# Patient Record
Sex: Female | Born: 1950
Health system: Western US, Academic
[De-identification: ages and names within clinical notes are randomized; demographics above are authoritative.]

---

## 2019-11-15 ENCOUNTER — Ambulatory Visit: Payer: MEDICARE

## 2019-11-15 DIAGNOSIS — M19041 Primary osteoarthritis, right hand: Secondary | ICD-10-CM

## 2019-11-15 DIAGNOSIS — M1811 Unilateral primary osteoarthritis of first carpometacarpal joint, right hand: Secondary | ICD-10-CM

## 2019-11-15 DIAGNOSIS — M25341 Other instability, right hand: Secondary | ICD-10-CM

## 2019-11-15 DIAGNOSIS — M65311 Trigger thumb, right thumb: Secondary | ICD-10-CM

## 2019-11-15 MED ADMIN — LIDOCAINE HCL 1 % IJ SOLN: INTRA_ARTICULAR | @ 23:00:00 | Stop: 2019-11-15 | NDC 00409427601

## 2019-11-15 MED ADMIN — TRIAMCINOLONE ACETONIDE 40 MG/ML IJ SUSP: INTRA_ARTICULAR | @ 23:00:00 | Stop: 2019-11-15 | NDC 00003029320

## 2019-11-15 NOTE — Progress Notes
Hand/Foot/Upper/Lower Extrem Drain/Inject: R thumb MCP, R thumb    Date/Time: 11/15/2019 2:40 PM  Performed by: Lavonia Drafts., MD  Authorized by: Lavonia Drafts., MD     Consent Given by:  Patient  Site marked: the procedure site was marked    Timeout: prior to procedure the correct patient, procedure, and site was verified    Indications:  Pain  Condition: trigger finger    Location:  Thumb  Site:  R thumb MCP  Site:  R thumb  Prep: patient was prepped and draped in usual sterile fashion    Approach:  Volar  Needle Size:  27 G  Medications:  20 mg triamcinolone acetonide 40 mg/mL; 1 mL lidocaine 1%  Patient tolerance:  Patient tolerated the procedure well with no immediate complications   Right trigger thumb

## 2019-11-15 NOTE — Progress Notes
DATE: 11/15/2019   NAME: Beth Hunt   MRN: [id number]   DOB: [birth year]   AGE: 69 y.o.       Lavonia Drafts, MD        Patient was seen in our [facility] office today.    Chief Complaint: Right thumb pain, stiffness & locking.    History: The patient presents today for hand/upper extremity orthopedic evaluation  regarding the above chief complaints. The patient is a 69 y.o. female, left-hand dominant, who presents with a 1 month history of symptoms after no recent injuries. The frequent pain is discomforting, severity 3/5, localized in the thumb without radiation. Moreover, the symptoms are exacerbated by use and alleviated by rest.    No previous studies have been obtained.   No previous med eval for this condition.     Past Medical History: The past medical history was reviewed by myself and the patient today.    Past Surgical History: None noted by the patient.    Medications: See attached patient medication list.    Allergies: Sulfa medications, penicillin, latex and tape.    Patient Social History: The patient is single, non smoker, social drinker  and lives alone.    Family Medical History: See patient questionnaire and intake form.     Review of Systems: The review of systems as documented today in the medical records is remarkable for the positive orthopedic problems discussed above and is also contributory with respect to Constitutional, ENT, Cardiovascular, GU, Skin, Neurologic, Endocrine, Hematologic, Psychiatric, Gastrointestinal, Respiratory, Eyes and Allergic/Immunologic systems.    Physical Examination:   Vitals:    11/15/19 1442   Weight: (!) 225 lb (102.1 kg)   Height: 5' 5'' (1.651 m)      Constitutional: in general the patient is well-developed and well-nourished female in some minor distress. Overall appearance and body habitus is normal.     Eyes: pupils are round, equal and reactive to light.    ENT: external examination of the head, neck and facial areas reveals no abnormalities.      Respiratory: regular breathing; unlabored.    Cardiovascular: regular rate with intact normal pulses.     Lymphatic: no evidence of lymphadenopathy.    Psychiatric: patient is alert and oriented to person, time and place, with a pleasant mood and affect. Speech is normal, and the patient is appropriately conversant.     Skin: no erythema, induration or other significant skin lesions.    Evaluation of the right arm proximal to the hand is normal.  Observation of the hand shows a catching phenomenon to be present in the thumb. Palpation of the proximal flexor tendon sheath at the level of the A1 pulley causes pain.  Palpation also reveals crepitus as the digit is flexed and extended. No mass is palpable at the A1 pulley area. There is pain, stiffness & locking.  Digital flexibility is slightly restricted.  In addition, there is some a mild thickening of the capsular tissues about the ulnar aspect of the thumb MCP J. Stress testing of this joint produces gross instability of the ulnar collateral ligament.  Surprisingly, she has no symptoms associated with the thumb instability.  Furthermore, there is pain localized to the area of the thumb basal joint.  The basal joint grinding in torquing test is mildly positive with mild pain and some crepitus.  She has minimal symptoms at the basal joint.  Arthritic changes are also evident at her distal interphalangeal joints where she has large  Heberden's nodes, but once again virtually no symptoms.  Sensibility is well-preserved to light touch.  The vascular status is normal. Skin tone and turgor are normal.    DIAGNOSES: 1.  Right thumb trigger digit.     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     3. 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      DISCUSSION: A thorough discussion was carried out with the patient regarding the origin of the triggering and locking in the digit.  I explained that this was a form of tendinitis called stenosing tenosynovitis which involves the flexor tendon and its associated sheath.  Because of swelling in the tendon, or narrowing and thickening of the sheath mechanism, the tendon is catching, producing the pain, stiffness and the catching or triggering-like phenomena.     Arthritis occurs when the normal smooth articular cartilage lining the joint is damaged.  Initially the cartilage becomes thinned and quite rough but is still present.  In this early stage of arthritis the patient will complain of an aching type pain which increases with activity, some mechanical-type symptoms such as a catching sensation and possibly swelling of the joint.  As the arthritis becomes progressively more severe and as the cartilage further deteriorates exposing underlying bone the pain will become correspondingly more frequent and severe.  Eventually the pain will occur with only minimal activity, and possibly even pain at rest.    Arthritis may follow a specific injury which causes damage to the articular cartilage and subsequent progressive deterioration.  Arthritis may also be due to repetitive overuse.  Some patients will develop arthritis as a consequence of an inflammatory/rheumatologic process such as rheumatoid arthritis, lupus, or gout which subsequently causes inflammation in the joint with progressive cartilage deterioration.  This type of arthritic problem will commonly affected multiple joints with some degree of symmetry.  In some patients there may also be a familial or genetic component.    Treatment for arthritis depends on the stage of the arthritis and the patient???s level of symptoms.  In the early or minor stages treatment consists of anti-inflammatory medication, both oral and topical, to reduce joint inflammation, and appropriate activity modifications to reduce the overall stress on the affected joints.  Homepathic medications such as glucosamine and chondroitin sulfate can also sometimes be effective.  On occasion cortisone injections can help to relieve the joint inflammation and arthritic pain.  Corticosteroids tend to work fairly rapidly but the effects are often temporary with resultant return of joint inflammation and pain.  Repeated corticosteroid injections can possibly result in weakening of the ligaments, tendon injury, and further articular cartilage deterioration.  Cosmetic side effects such as blanching of the skin and fat atrophy with a dimpling effect may also occur.  Corticosteroid injections should be limited to not more than every 3-4 months.  Exercise is also very helpful as it maintains strength, joint range of motion, overall functionality which also helps to alleviate the arthritic pain.  An ???arthritis assessment??? with an occupational therapist may also be helpful in educating the patient on the use of adaptive aids, assistive devices and joint specific splints; and also on maintaining joint flexibility with preserved functionality.  Referral to a Rheumatologist for medical management of the arthritic condition is also very helpful especially if the patient is not in need of orthopedic surgery to treat the underlying arthritic condition.      PLAN: Appropriate orthopedic care would now consist of conservative management.  This includes  avoidance of any abusive activities such as repetitive and forceful gripping, heavy lifting, or any associated hand-intensive activities.  The prescription of a anti-inflammatory medication, as well as a cortisone injection into the flexor tenosynovial sheath, may also be beneficial. I explained that with a cortisone injection there is an approximate 65-75% chance of relief.  Sometimes additional injections are necessary if there is incomplete relief of the painful symptoms or a recurrence of the triggering.  Approximately 25-35% of patients will not obtain significant relief from the above measures, and a surgical decompression would then be indicated.  This is done as an outpatient procedure utilizing a local anesthetic block.  If surgery is eventually necessary, the prognosis for successful outcome is extremely good.  I think she would do well by starting off with either a NSAID medicine or a cortisone injection. Possible side effects of each treatment option discussed.  She prefers the injection.    Today under sterile conditions the flexor tenosynovial sheath of the right trigger thumb was safely injected with 2 ml of corticosteroid and 1 ml of local anesthetic.  The patient was informed and understands that the injection could cause some blanching of the skin, and fat atrophy with a dimpling of the soft tissues in the palmar finger area. Rarely is there infection or injury to the soft tissues or flexor tendon from the needle or the medication. Overall, cortisone is a generally safe medicine with very few patients developing an abnormal medical reaction. There is about a 65-75% chance of success with the injection. On occasion, additional injections may be needed. If the cortisone injections fail to alleviate the triggering of the finger, surgery would be an option for further care.      In regard to the osteoarthritis that is developing in her hand, she has virtually no symptoms and consequently specific care is not warranted at this point time.  I did discuss with her in detail the fact that she has instability of the thumb MCP J due to an incompetent ligament, and that the natural history would involve progression to joint arthritis which could become symptomatic for her in the future.  Once again, given the fact that she is asymptomatic today, she is not desirous of pursuing any further care.  Further follow-up when needed.    I spent total 30 minutes formulating todays visit which included:    [x] Preparing to see the patient (e.g., review of tests)   [x] Obtaining and/or reviewing separately obtained history   [x] Performing a medically appropriate examination and/or evaluation   [x] Counseling and educating the patient/family/caregiver   [x] Documenting clinical information in the electronic or other health record   [x] Independently interpreting results (not separately reported) and communicating results to the patient/family/caregiver   [x] Care coordination (not separately reported)    Lavonia Drafts, MD  ORTHOPEDIC SURGEON

## 2020-08-28 IMAGING — MG MAMMOGRAPHY SCREENING BILATERAL 3D TOMOSYNTHESIS WITH CAD
7 series · 8 of 19 positions shown · non-contrast
Comparison: 11/07/1808 04/07/2013.

MAMMOGRAPHY SCREENING BILATERAL 3D TOMOSYNTHESIS WITH CAD, 08/28/2020 [DATE]: 
CLINICAL INDICATION: Screening exam.
TECHNIQUE: Digital bilateral mammograms and 3-D Tomosynthesis were obtained. 
These were interpreted both primarily and with the aid of computer-aided 
detection system. 
BREAST DENSITY: (Level D) The breasts are extremely dense, which lowers the 
sensitivity of mammography.

[L MLO]
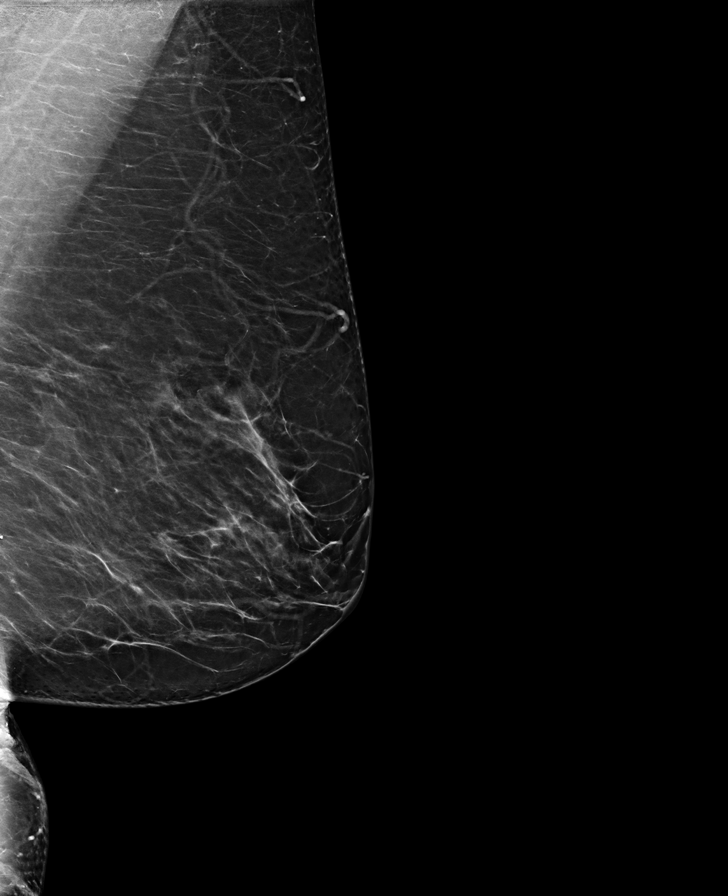

[L CC]
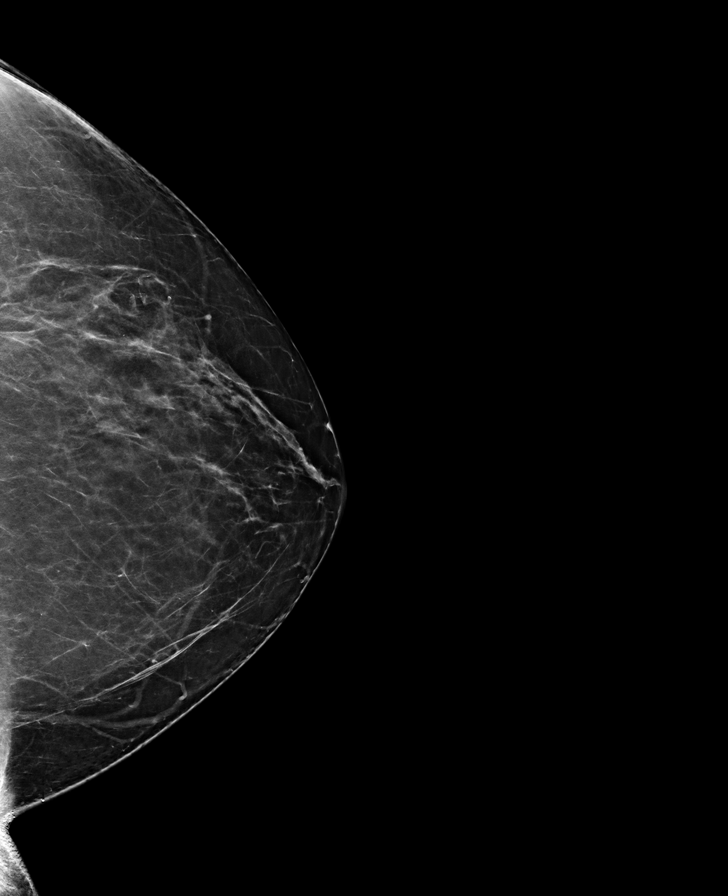

[R CC]
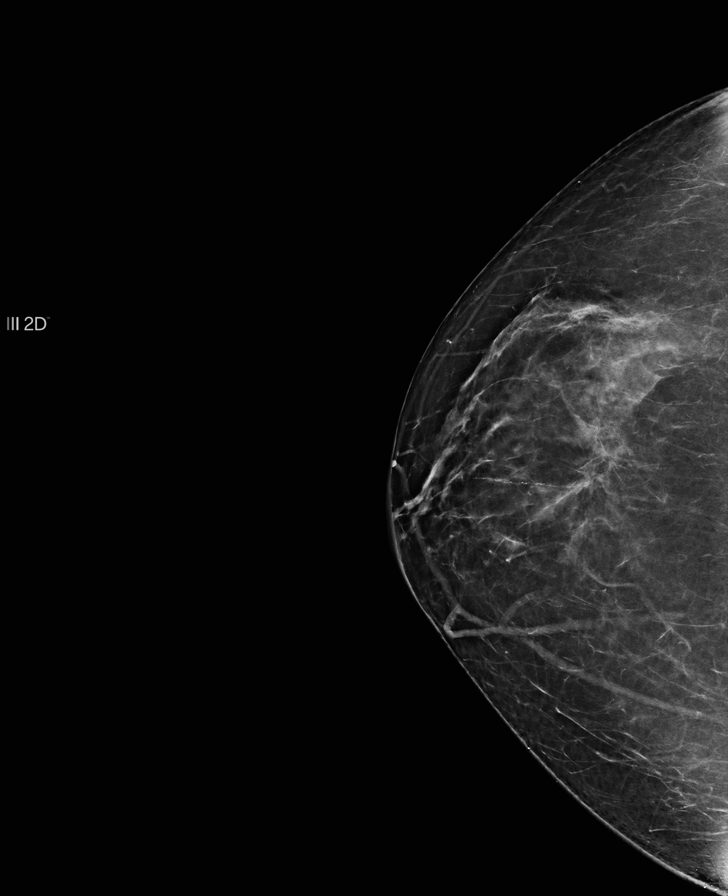

[R MLO]
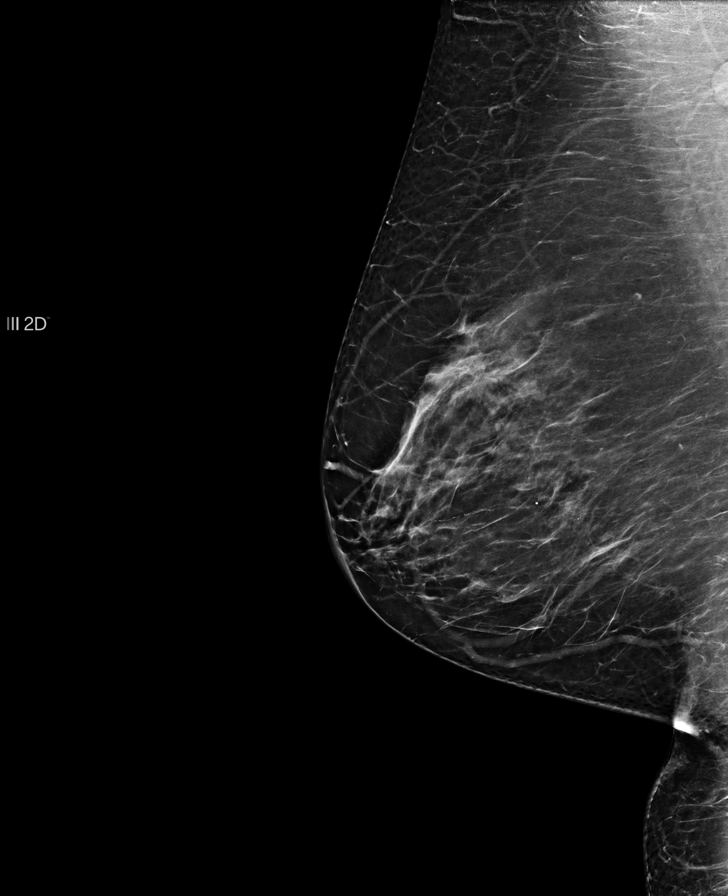

[L CC tomo · 2 of 78 frames shown]
[frame 26/78]
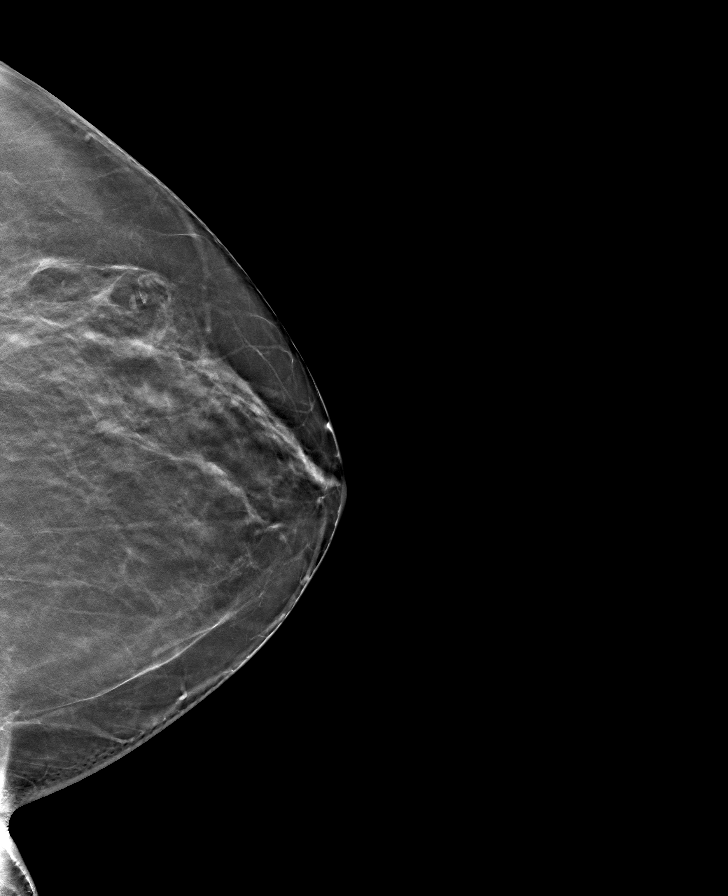
[frame 39/78]
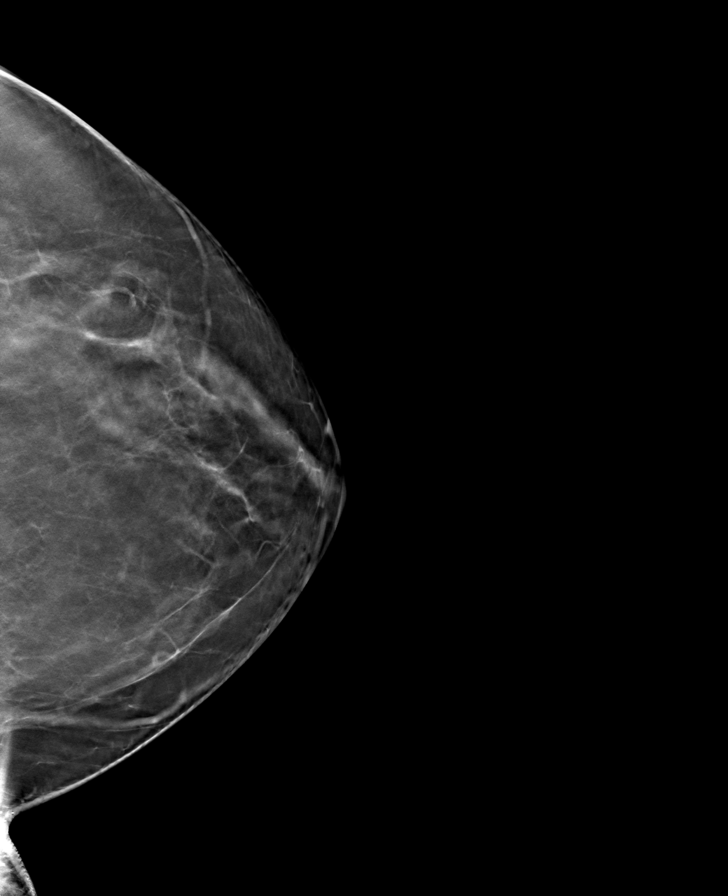

[L MLO tomo · tomo slice 40/79.0]
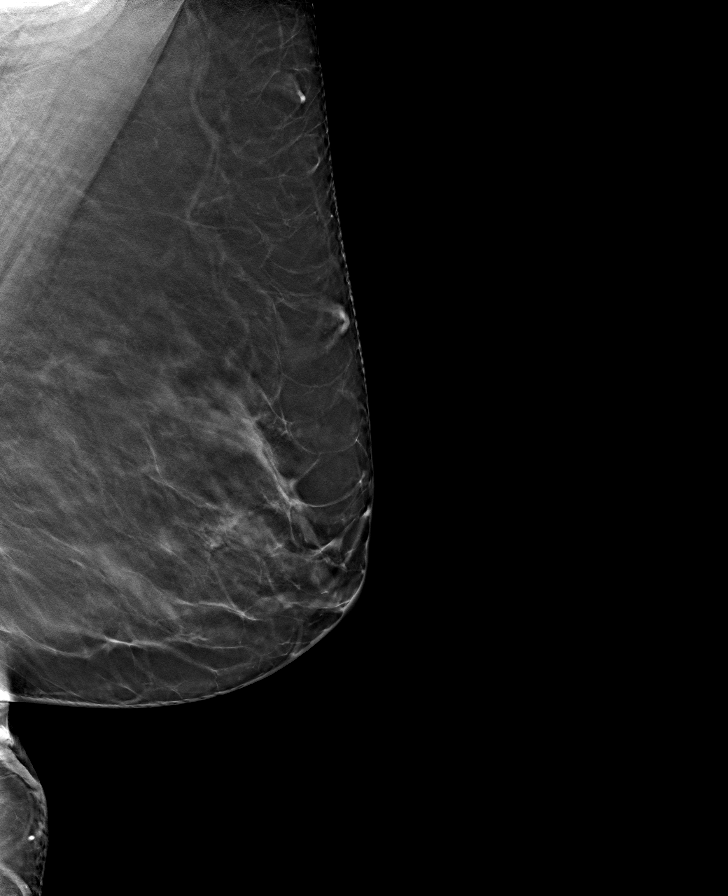

[R CC tomo · tomo slice 39/78.0]
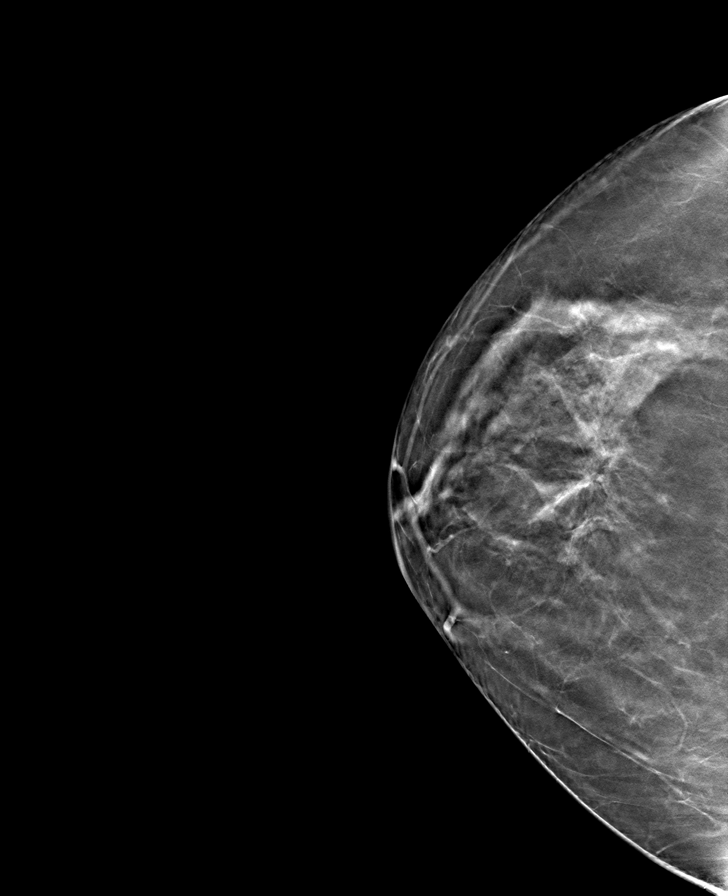

[8 of 19 positions shown; findings below may reference images not displayed]

FINDINGS: Benign calcification No suspicious mass, calcifications, or area of 
architectural distortion in either breast.
IMPRESSION: Stable mammogram. 
( BI-RADS 2) Benign findings. Routine mammographic follow-up is recommended.

## 2021-04-01 ENCOUNTER — Ambulatory Visit: Payer: MEDICARE

## 2021-09-04 IMAGING — MR MRI RIGHT KNEE WITHOUT CONTRAST
4 of 6 series · 20 of 40 positions shown · IV contrast (gadolinium)
Comparison: None

________________________________________________________________________________________________ 
MRI RIGHT KNEE WITHOUT CONTRAST, 09/04/2021 [DATE]: 
CLINICAL INDICATION: Chondromalacia patella, right knee.
TECHNIQUE: Multiplanar, multiecho position MR images of the knee were performed 
without intravenous gadolinium enhancement. Patient was scanned on a
magnet.

[Series 101: survey_fullfov_transversal · axial · 10.0mm · 1.84mm/px · 1 of 7 slices shown]
[im 1/7]
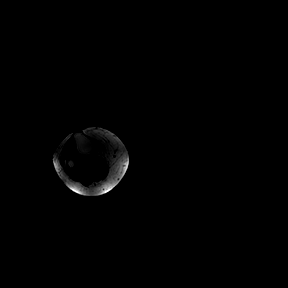

[Series 201: survey_ · axial · 10.0mm · 0.78mm/px · z∈[-20,+125]mm · 2 of 9 slices shown]
[im 1/9]
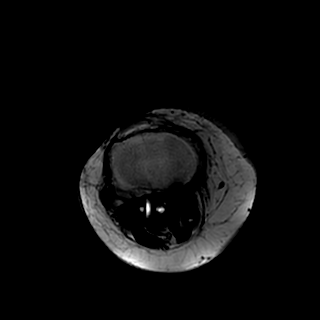
[im 9/9]
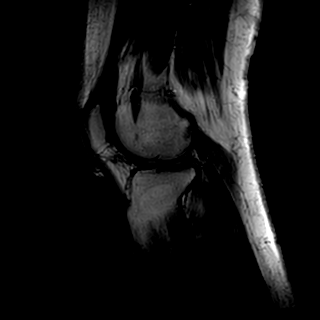

[Series 301: (person_name)_(person_name)_(person_name) · axial · 3.0mm · 0.33mm/px · z∈[-54,+68]mm · 9 of 36 slices shown]
[im 1/36]
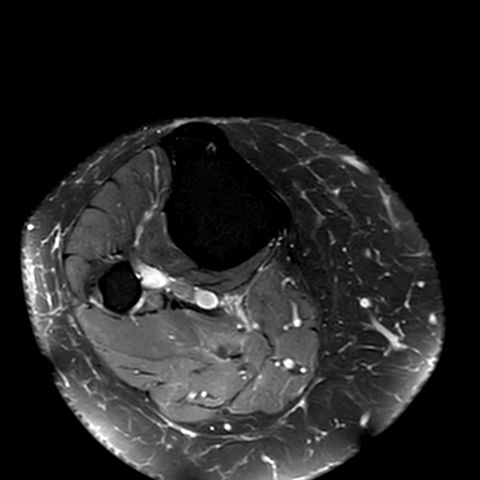
[im 5/36]
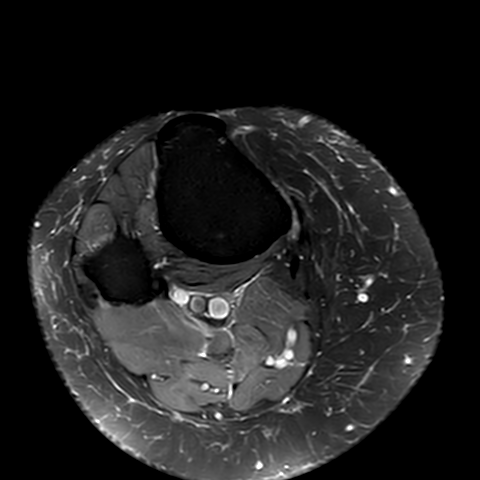
[im 9/36]
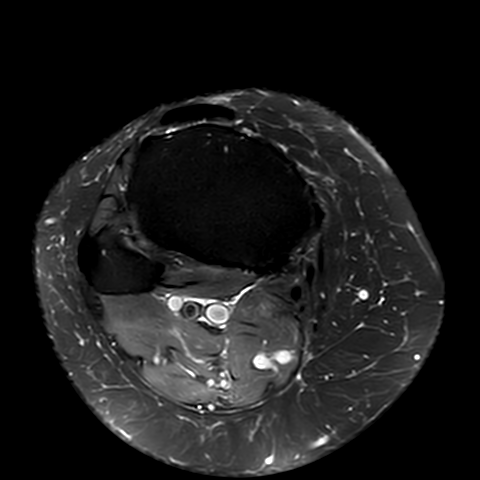
[im 14/36]
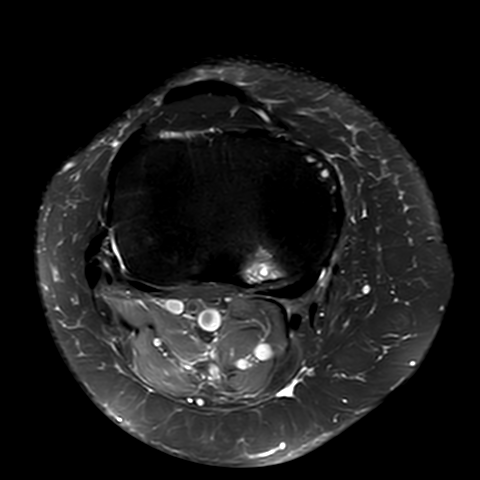
[im 18/36]
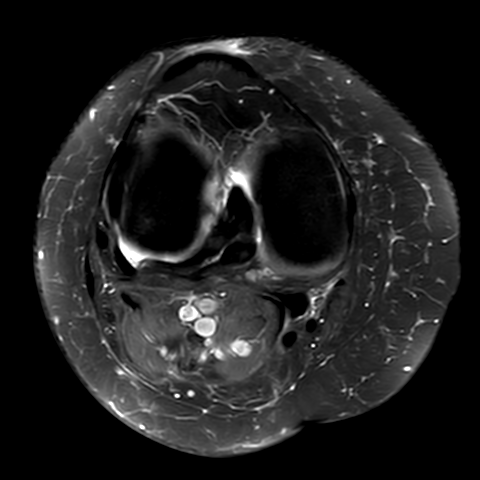
[im 22/36]
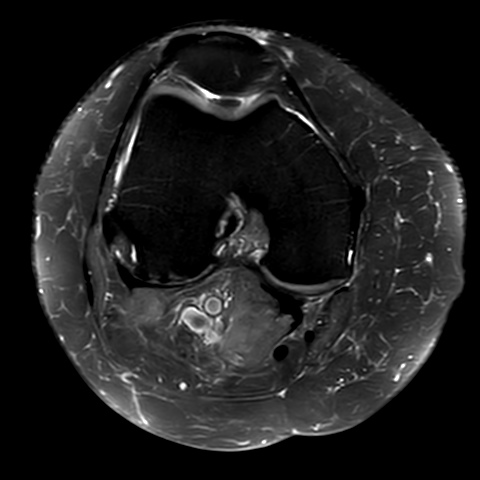
[im 27/36]
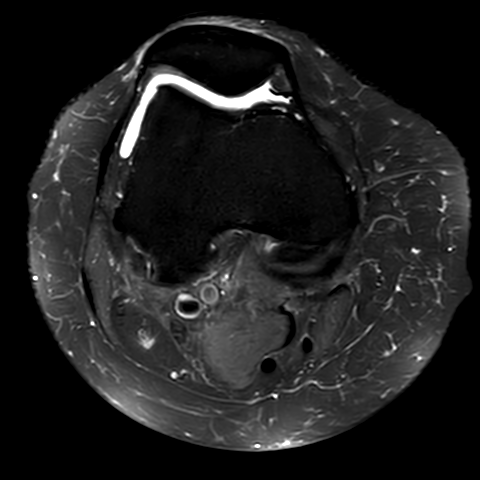
[im 31/36]
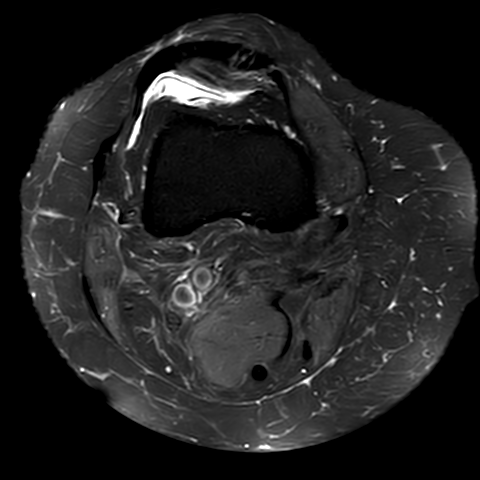
[im 36/36]
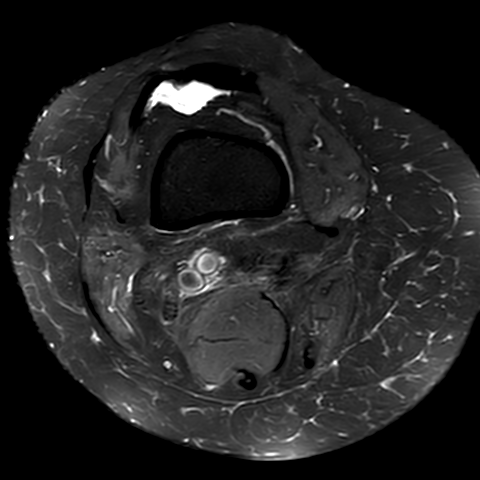

[Series 401: pd_fs_sag fh · sagittal · 3.0mm · 0.29mm/px · 8 of 32 slices shown]
[im 1/32]
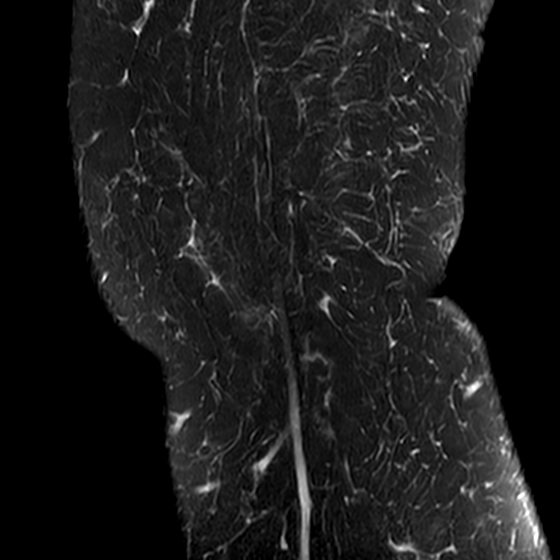
[im 5/32]
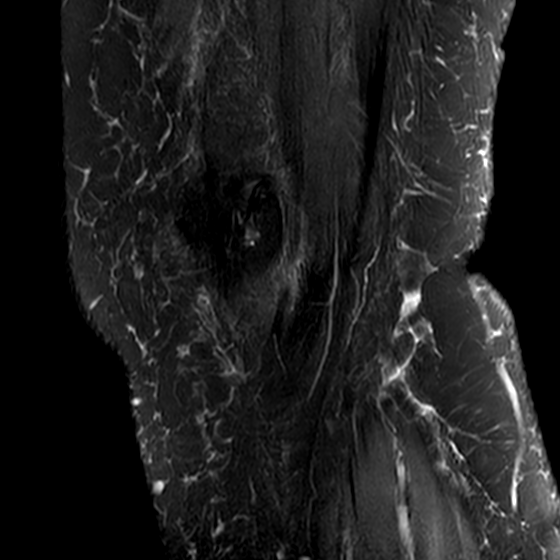
[im 9/32]
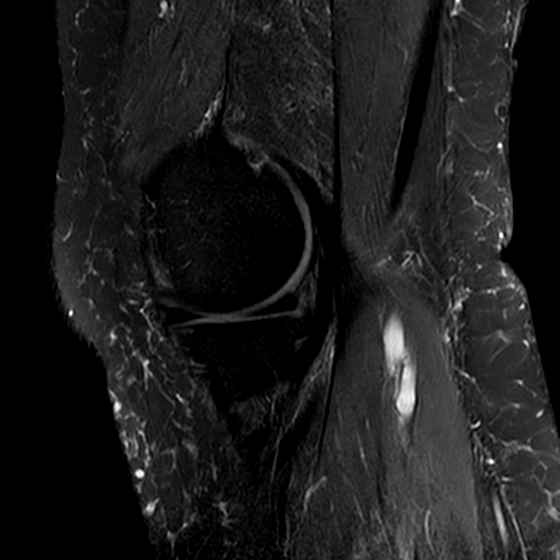
[im 14/32]
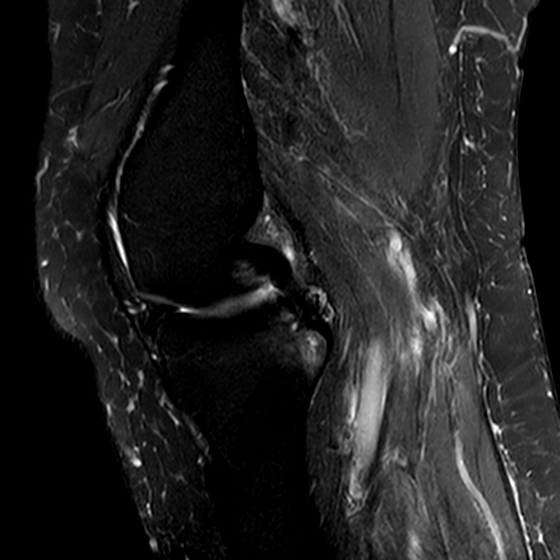
[im 18/32]
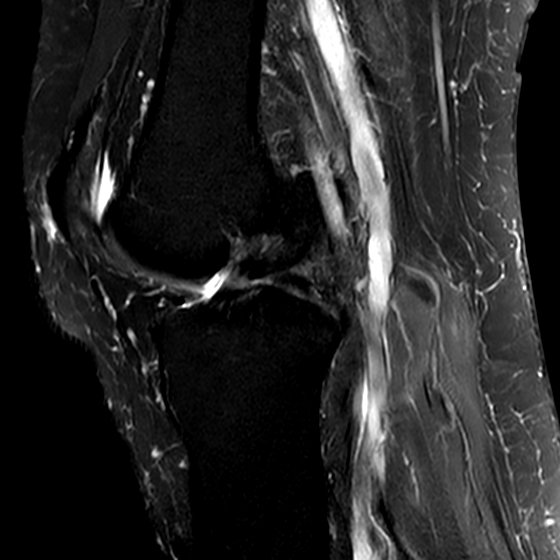
[im 23/32]
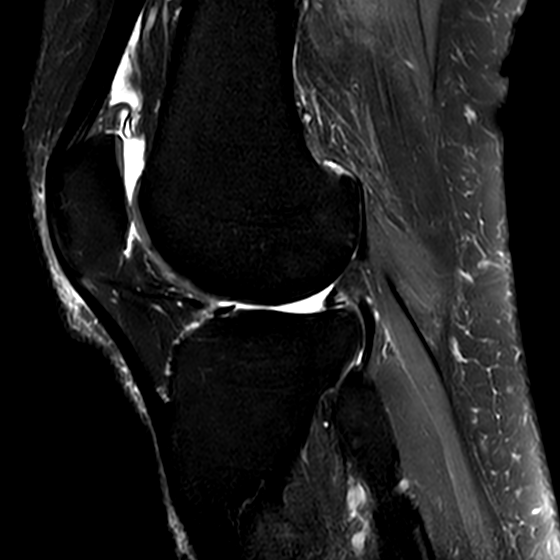
[im 27/32]
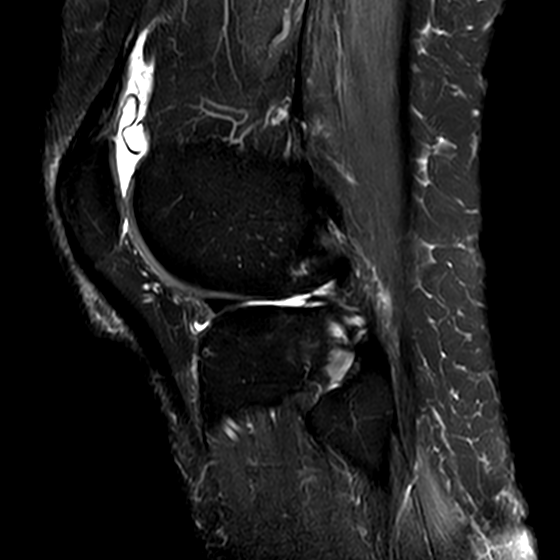
[im 32/32]
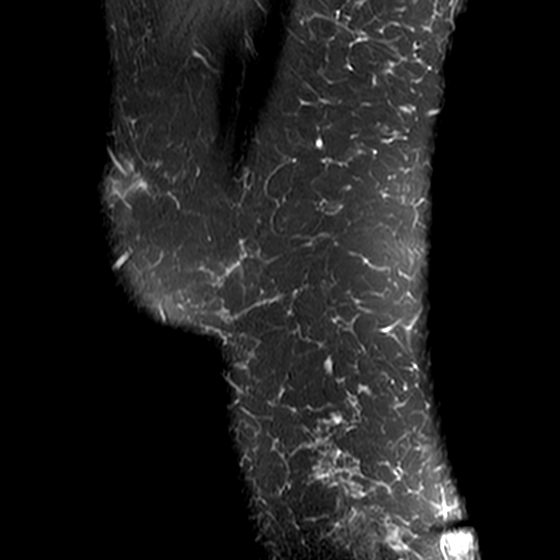

[20 of 40 positions shown; findings below may reference images not displayed]

FINDINGS: MEDIAL COMPARTMENT: The medial meniscus is intact without tear or extrusion. 
Grade III cartilage fissure of the weightbearing surface of the medial femoral 
condyle and mild adjacent grade II chondromalacia. Posterior medial tibial 
plateau osteophytes, subcortical cystic change and marrow edema. 
LATERAL COMPARTMENT: Complex tear and truncation of the posterior horn and body 
of the medial meniscus. Lateral meniscal extrusion. Up to grade IV 
chondromalacia. Remodeling of the posterior lateral tibial plateau with mild 
concavity.  
PATELLOFEMORAL COMPARTMENT: The patella is centrally located. Grade II- III 
chondromalacia of the patella and central trochlea. Tiny osteophytes. 
TIBIOFIBULAR COMPARTMENT: Negative. 
LIGAMENTS: The anterior cruciate ligament is intact. The posterior cruciate 
ligament is intact with mild intermediate signal intensity in the mid ligament. 
The medial collateral ligament and lateral collateral ligaments are preserved. 
EXTENSOR MECHANISM: The quadriceps and patellar tendon are preserved. The medial 
and lateral retinacula are intact. 
POSTEROMEDIAL CORNER: The semimembranosus and pes anserine tendons are 
preserved. The posterior oblique ligament and posterior medial joint capsule are 
intact. 
POSTEROLATERAL CORNER: The popliteal tendon and popliteofibular ligament are 
intact. The biceps femoris is negative. 
BONES: No fracture or contusion or stress response.  
ADDITIONAL FINDINGS: Moderate joint effusion. Non-thickened plica. No popliteal 
cyst. The musculature is normal without mass, signal abnormality or atrophy. 
Neurovascular bundles are negative. Subcutaneous tissues are negative.
IMPRESSION: 1.  Complex tear and truncation of the posterior horn and body of the medial 
meniscus.  
2.  Marked lateral compartment, moderate patellofemoral and mild medial 
compartment degenerative change. 
3.  Moderate joint effusion.

## 2022-01-06 IMAGING — DX KNEE 4 VIEWS LEFT
1 series · 4 of 4 positions shown · non-contrast
Comparison: None.

________________________________________________________________________________________________ 
KNEE 4 VIEWS LEFT, 01/06/2022 [DATE]: 
CLINICAL INDICATION: Pain.

[Series 1: AP · 0.14mm/px · 4 of 4 slices shown]
[im 1/4]
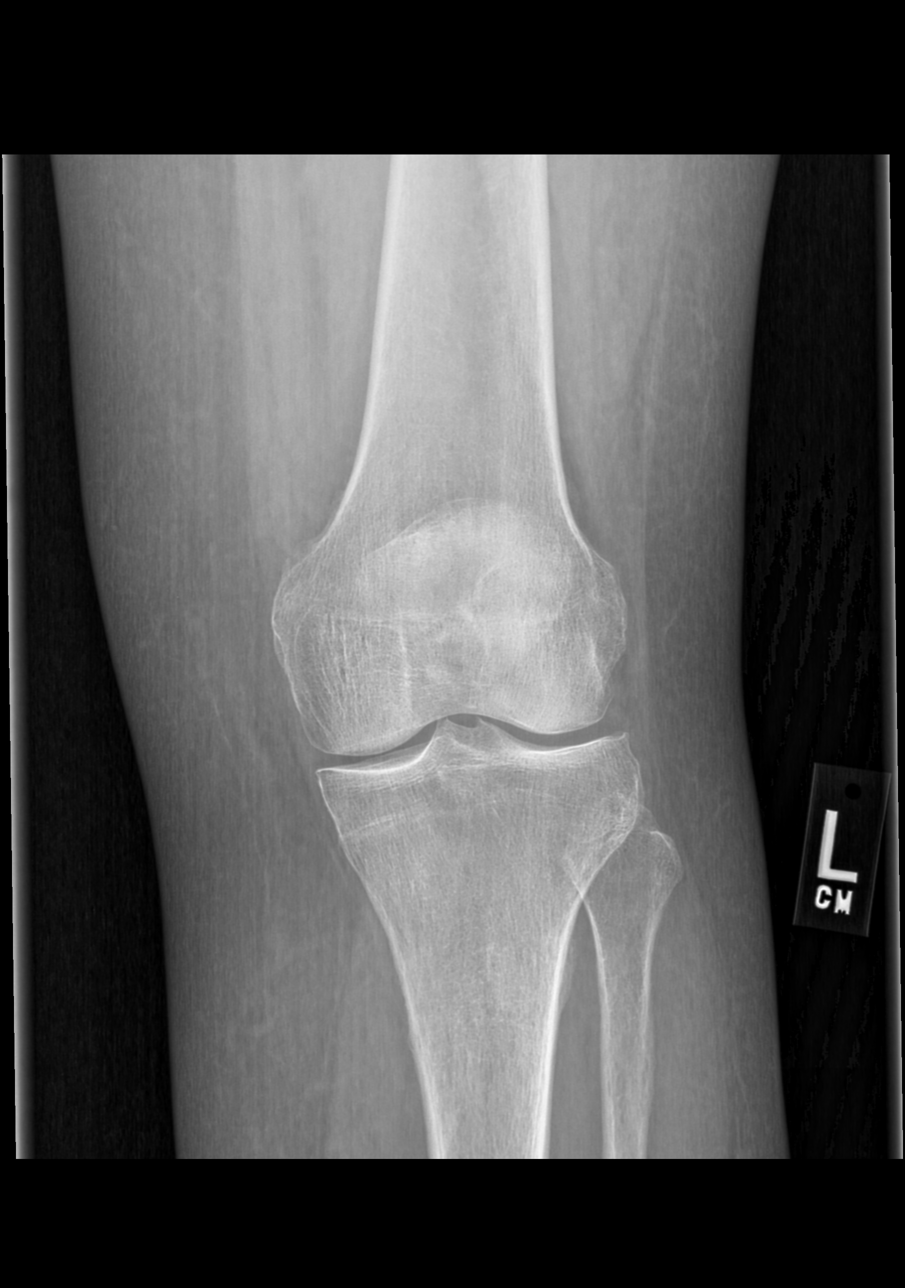
[im 2/4]
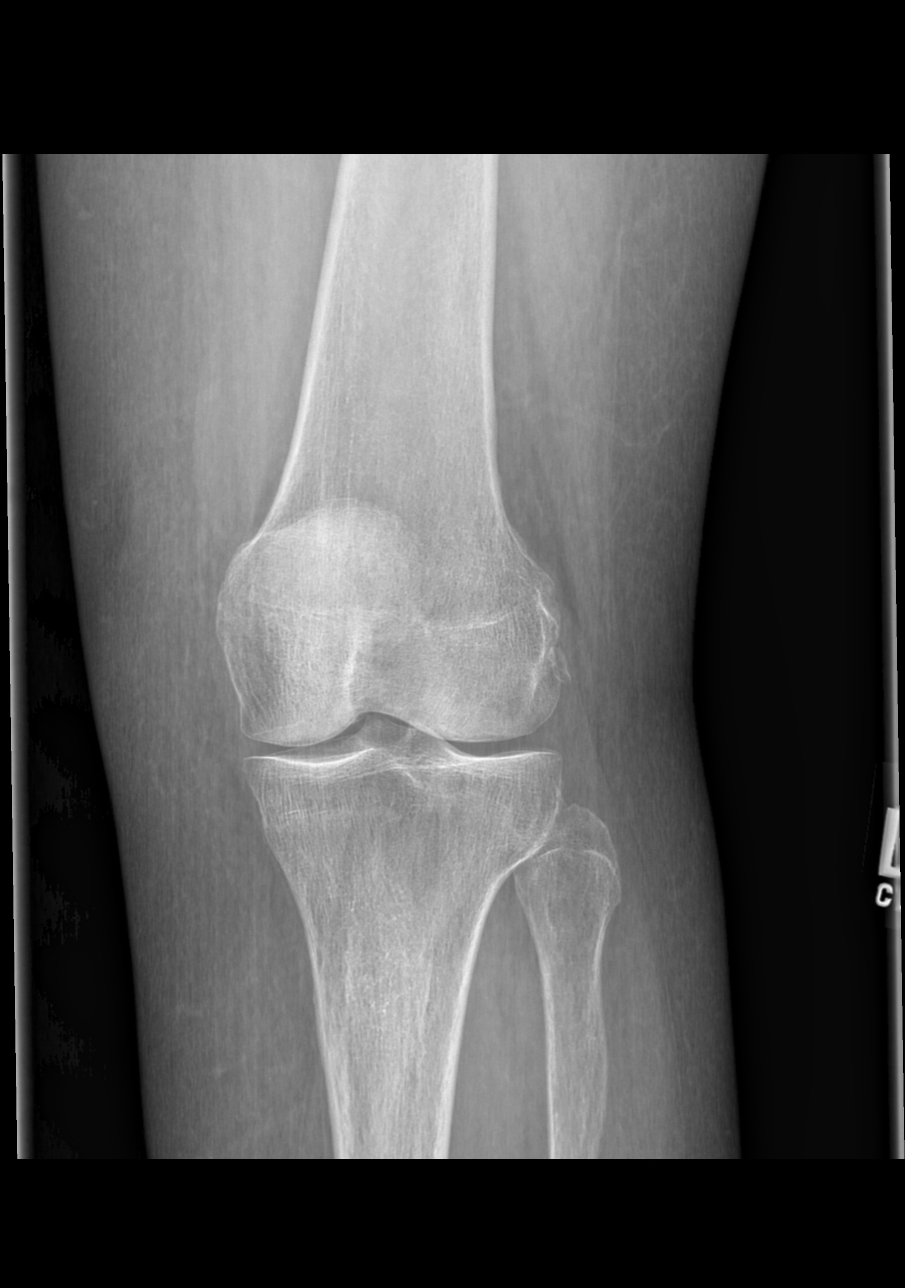
[im 3/4]
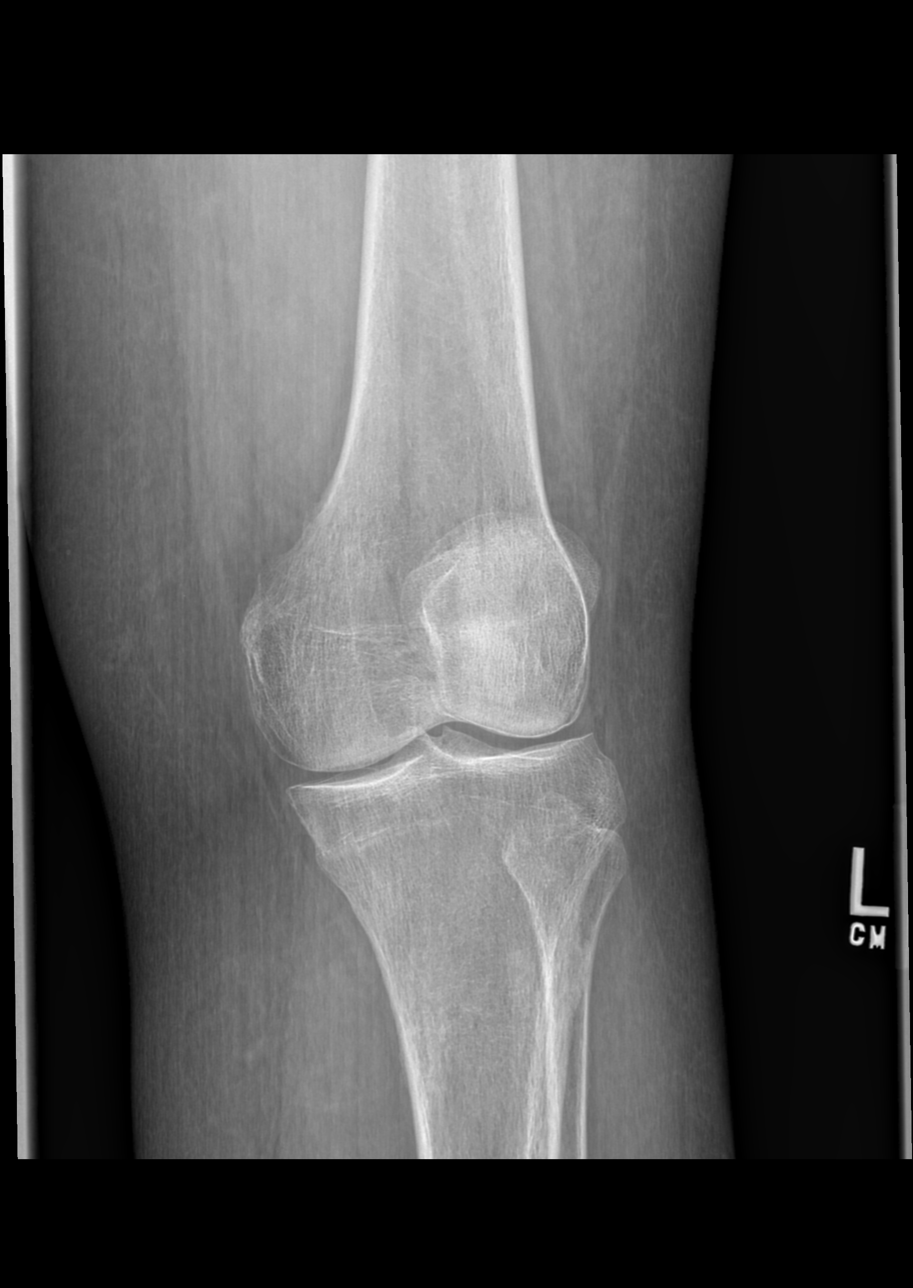
[im 4/4]
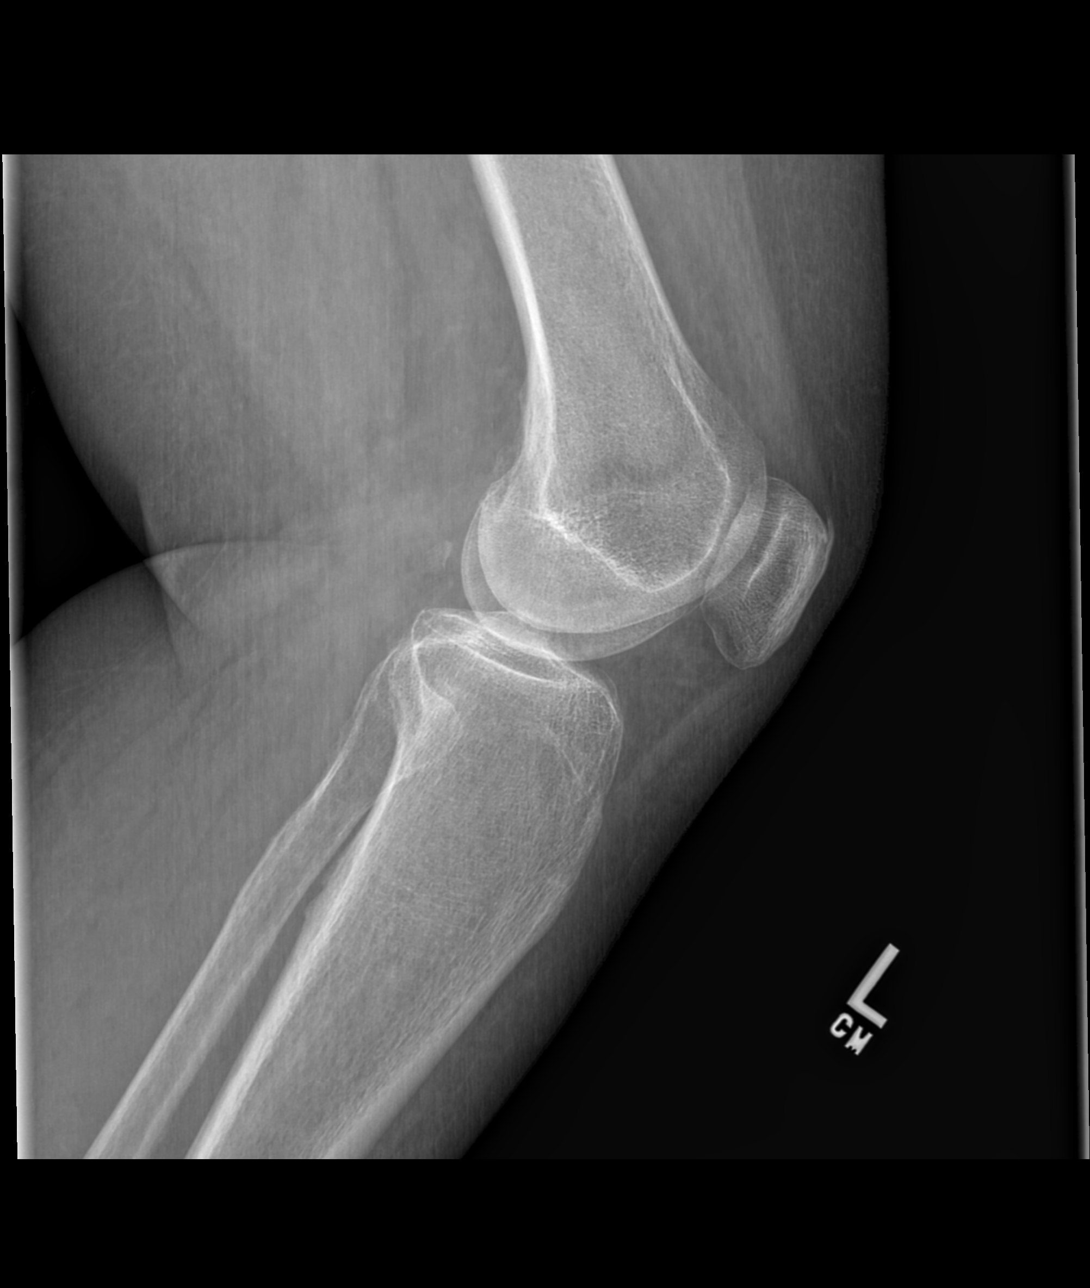

[4 of 4 positions shown; findings below may reference images not displayed]

FINDINGS: Atherosclerotic changes. Small enthesophyte superior patella. No 
joint effusion. Joint spaces appear well-maintained.
IMPRESSION: No significant arthritic changes noted. Please see discussion above.

## 2022-01-12 IMAGING — MR MRI LEFT KNEE WITHOUT CONTRAST
5 of 6 series · 23 of 40 positions shown · IV contrast (gadolinium)
Comparison: 12/07/2021 radiographs

________________________________________________________________________________________________ 
MRI LEFT KNEE WITHOUT CONTRAST, 01/12/2022 [DATE]: 
CLINICAL INDICATION: Pain in left knee.
TECHNIQUE: Multiplanar, multiecho position MR images of the knee were performed 
without intravenous gadolinium enhancement. Patient was scanned on a
magnet.

[Series 101: survey_fullfov_transversal · axial · 10.0mm · 1.84mm/px · z∈[-51,+51]mm · 2 of 7 slices shown]
[im 1/7]
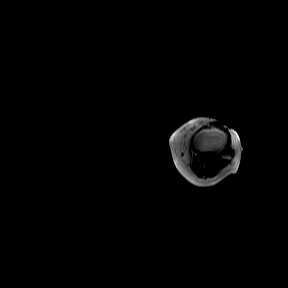
[im 7/7]
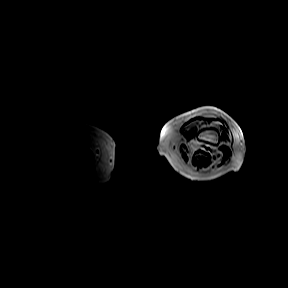

[Series 201: survey_ · axial · 10.0mm · 1.17mm/px · z∈[-20,+149]mm · 2 of 9 slices shown]
[im 1/9]
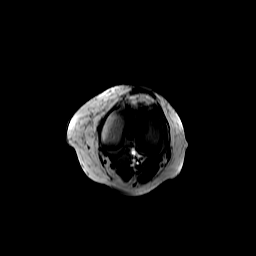
[im 9/9]
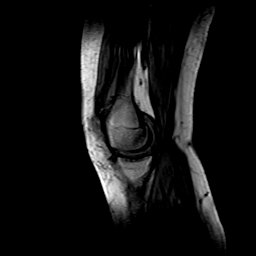

[Series 301: (person_name)_(person_name)_(person_name) · axial · 3.0mm · 0.37mm/px · z∈[-97,+28]mm · 8 of 36 slices shown]
[im 1/36]
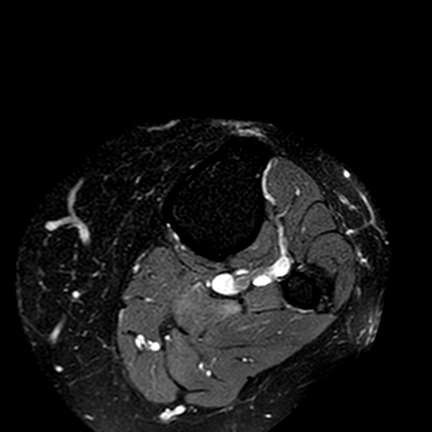
[im 4/36]
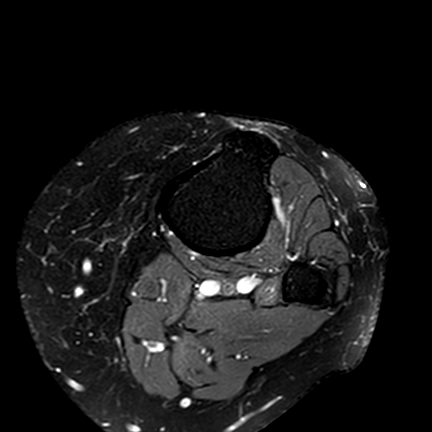
[im 12/36]
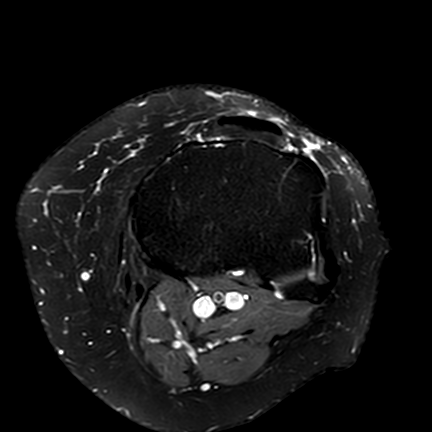
[im 16/36]
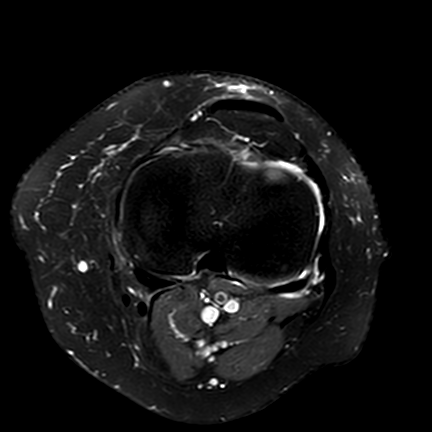
[im 20/36]
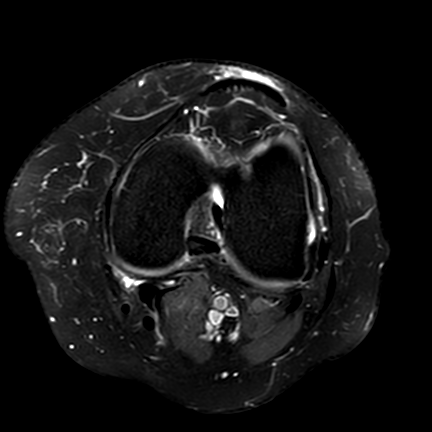
[im 24/36]
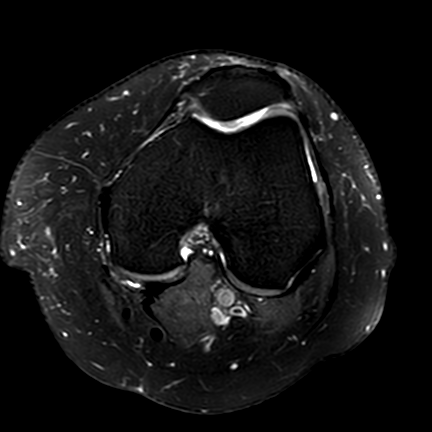
[im 32/36]
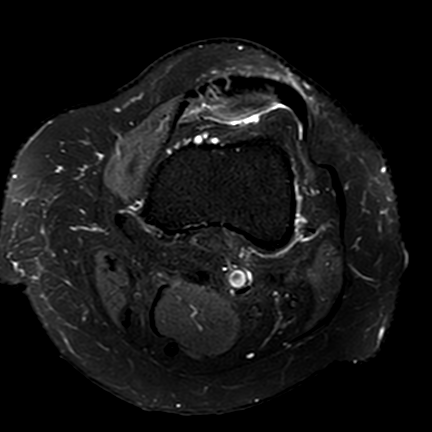
[im 36/36]
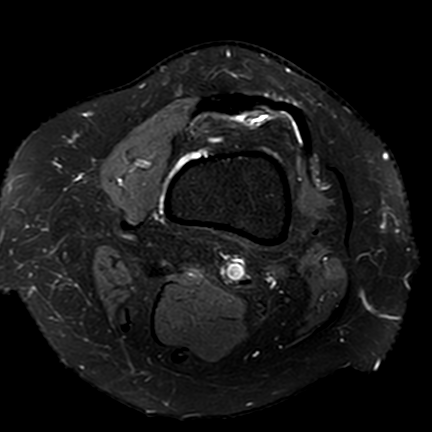

[Series 401: pd_fs_sag fh · sagittal · 3.0mm · 0.31mm/px · 8 of 30 slices shown]
[im 1/30]
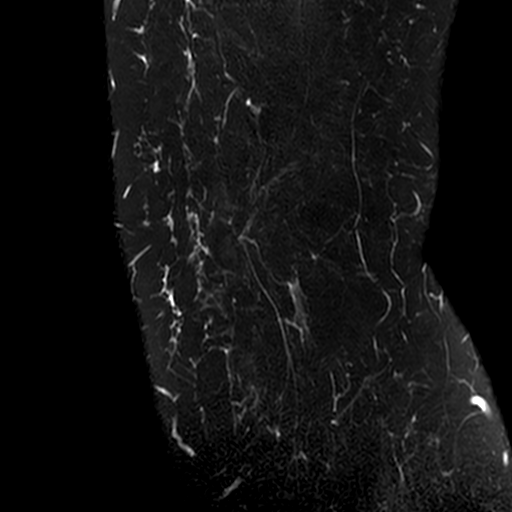
[im 5/30]
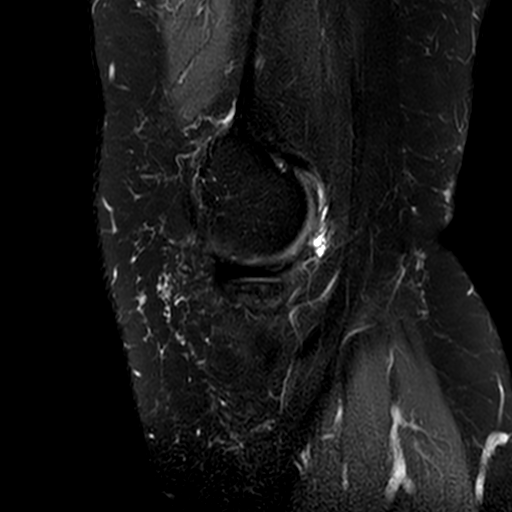
[im 9/30]
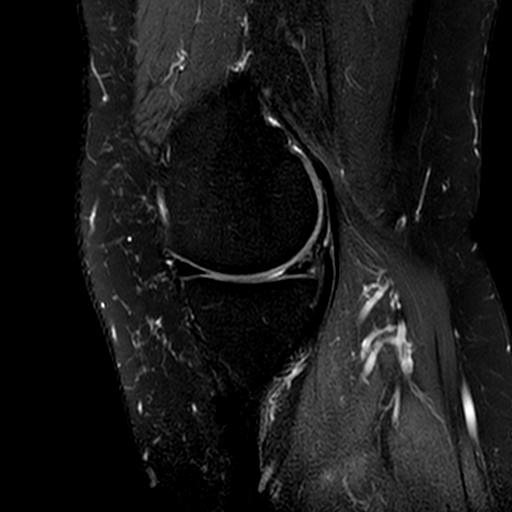
[im 13/30]
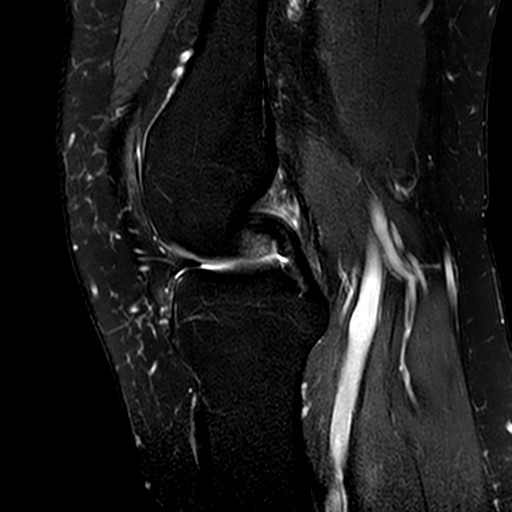
[im 17/30]
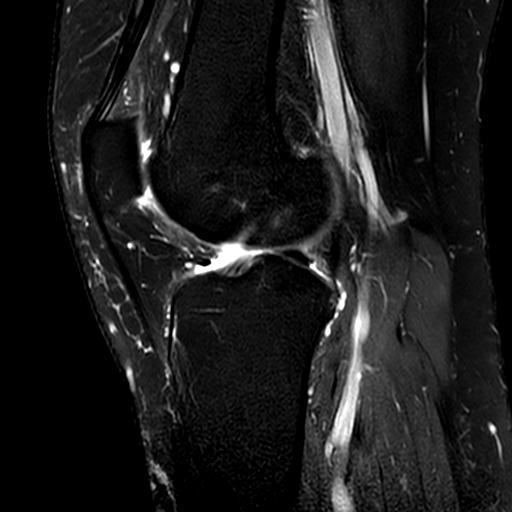
[im 21/30]
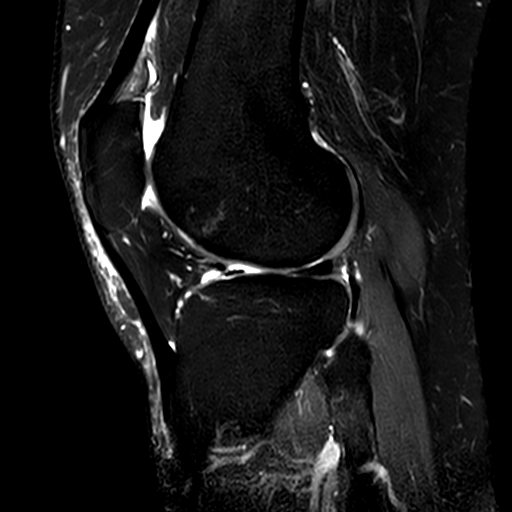
[im 25/30]
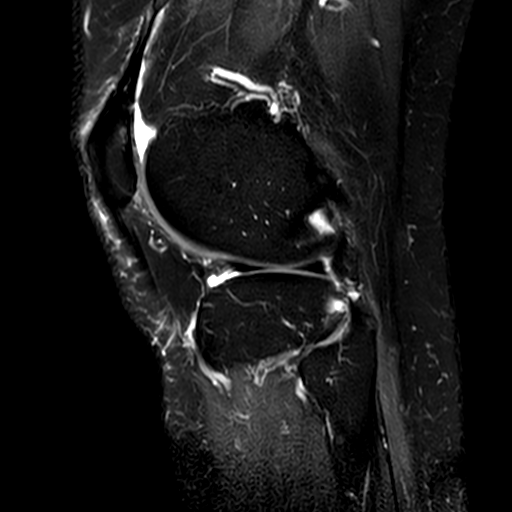
[im 30/30]
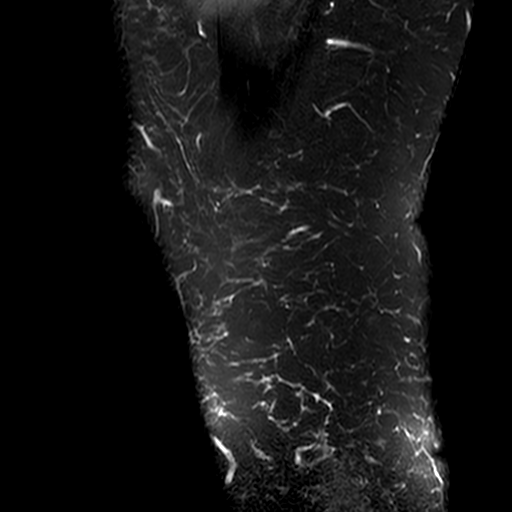

[Series 501: t1_cor · coronal · 3.0mm · 0.31mm/px · 3 of 32 slices shown]
[im 1/32]
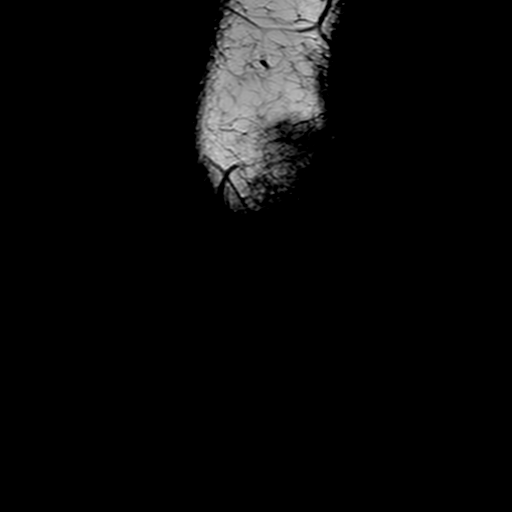
[im 4/32]
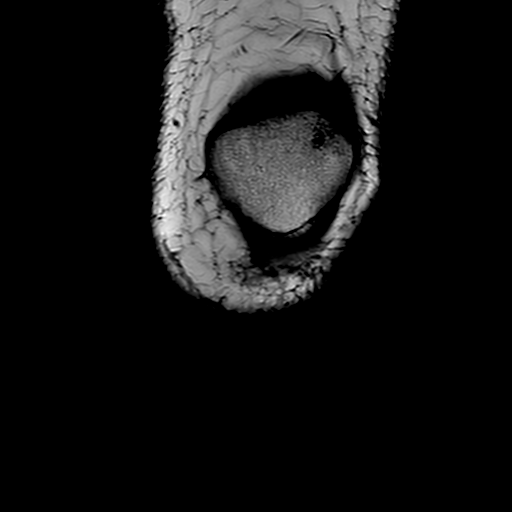
[im 8/32]
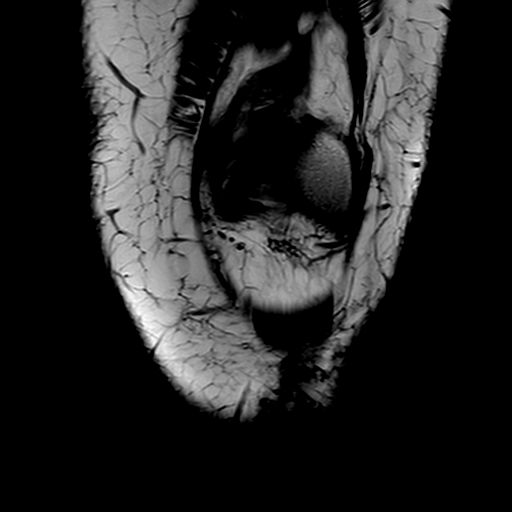

[23 of 40 positions shown; findings below may reference images not displayed]

FINDINGS: MEDIAL COMPARTMENT: Complex tear of the posterior horn of the medial meniscus. 
Up to grade II chondromalacia. Tiny osteophytes. 
LATERAL COMPARTMENT: The lateral meniscus is intact without tear or extrusion. 
Small foci of grade III chondromalacia and grade II chondromalacia of the 
remaining joint. Tiny osteophytes.  
PATELLOFEMORAL COMPARTMENT: The patella is centrally located. Up to grade IV 
chondromalacia and subcortical cystic change.  
TIBIOFIBULAR COMPARTMENT: Negative. 
LIGAMENTS: The anterior cruciate ligament is intact. The posterior cruciate 
ligament is intact. The medial collateral ligament and lateral collateral 
ligaments are preserved. 
EXTENSOR MECHANISM: The quadriceps and patellar tendon are preserved. The medial 
and lateral retinacula are intact. 
POSTEROMEDIAL CORNER: The semimembranosus and pes anserine tendons are 
preserved. The posterior oblique ligament and posterior medial joint capsule are 
intact. 
POSTEROLATERAL CORNER: The popliteal tendon and popliteofibular ligament are 
intact. The biceps femoris is negative. 
BONES: Normal bone marrow signal intensity. No fracture or contusion or stress 
response.  
ADDITIONAL FINDINGS: Small knee joint effusion. Non-thickened plica. No 
popliteal cyst. The musculature is normal without mass, signal abnormality or 
atrophy. Neurovascular bundles are negative. Mild soft tissue swelling.
IMPRESSION: 1.  Complex tear of the posterior horn of the medial meniscus. 
2.  Moderate to marked patellofemoral, moderate lateral and mild medial 
compartment chondromalacia. 
3.  Small joint effusion and mild soft tissue swelling.

## 2022-12-29 IMAGING — MR MRI LEFT ANKLE WITHOUT CONTRAST
4 of 6 series · 17 of 40 positions shown · non-contrast
Comparison: None

________________________________________________________________________________________________ 
MRI LEFT ANKLE WITHOUT CONTRAST, 12/29/2022 [DATE]: 
CLINICAL INDICATION: Achilles tendinosis
TECHNIQUE: Multiplanar, multisequence imaging of the ankle obtained without 
contrast. Patient was scanned on a 3T magnet.

[Series 301: PD fat-sat · sagittal · left · 2.5mm · 0.37mm/px · 6 of 28 slices shown (1 of 4)]
[im 1/28]
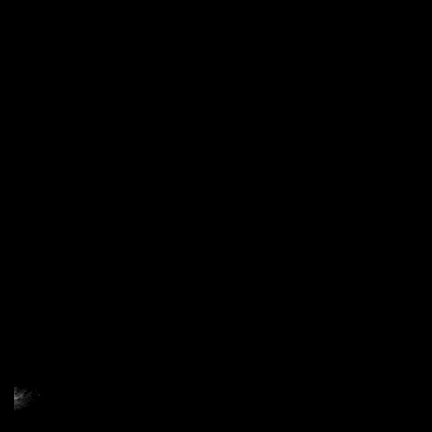
[im 6/28]
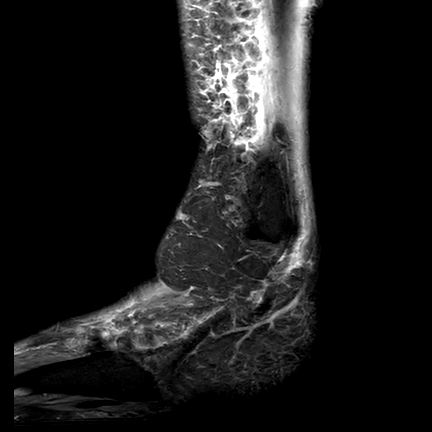
[im 11/28]
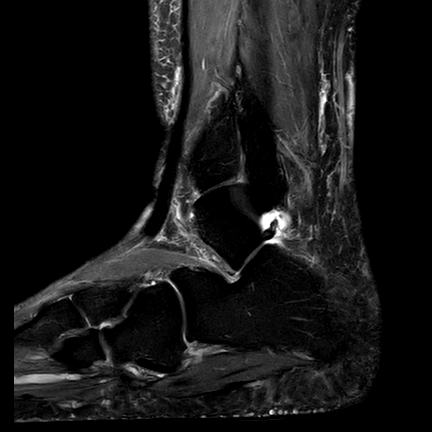
[im 17/28]
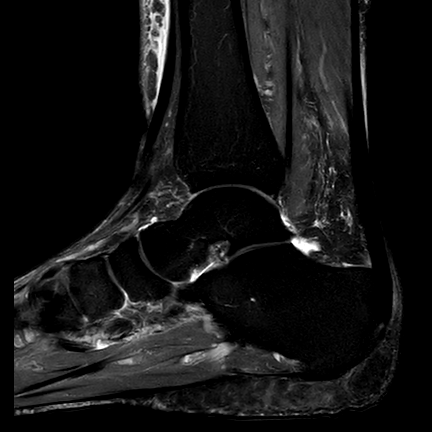
[im 22/28]
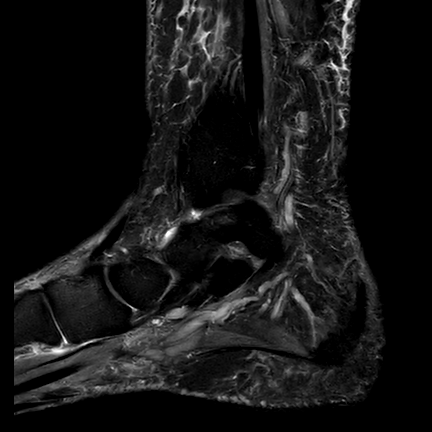
[im 28/28]
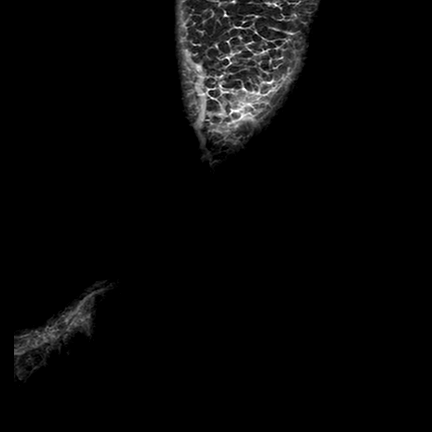

[Series 501: PD fat-sat · axial · left · 3.0mm · 0.30mm/px · z∈[-16,+120]mm · 5 of 46 slices shown (2 of 4)]
[im 1/46]
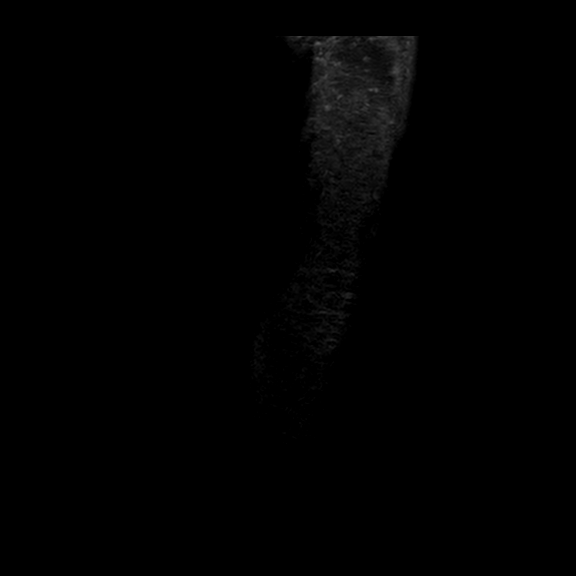
[im 6/46]
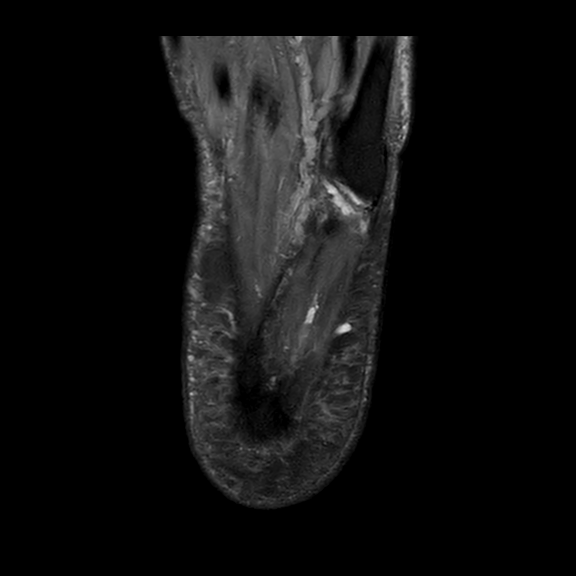
[im 12/46]
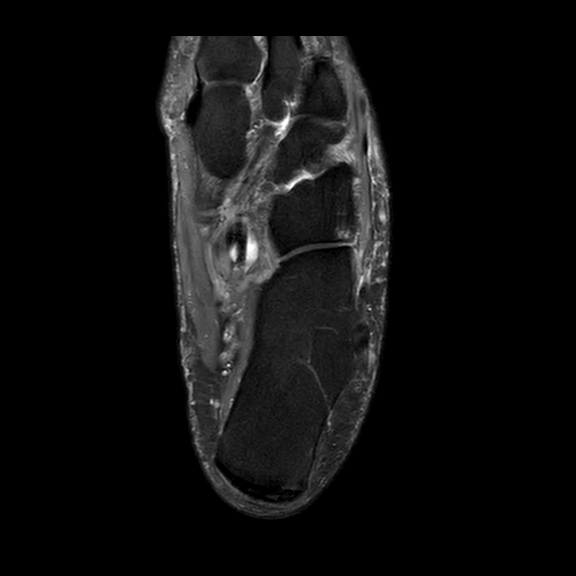
[im 23/46]
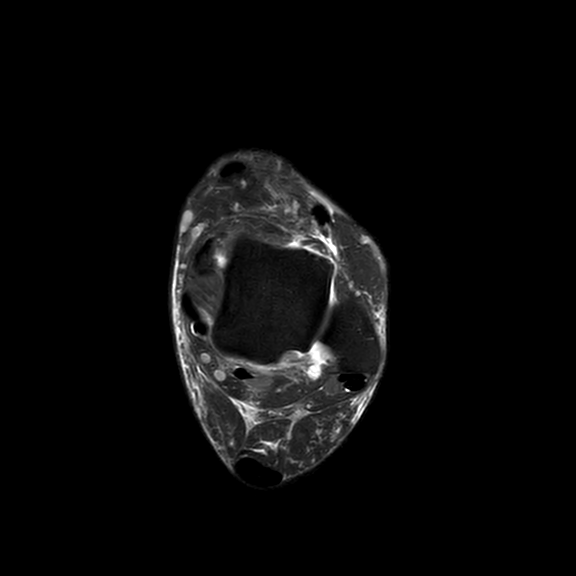
[im 40/46]
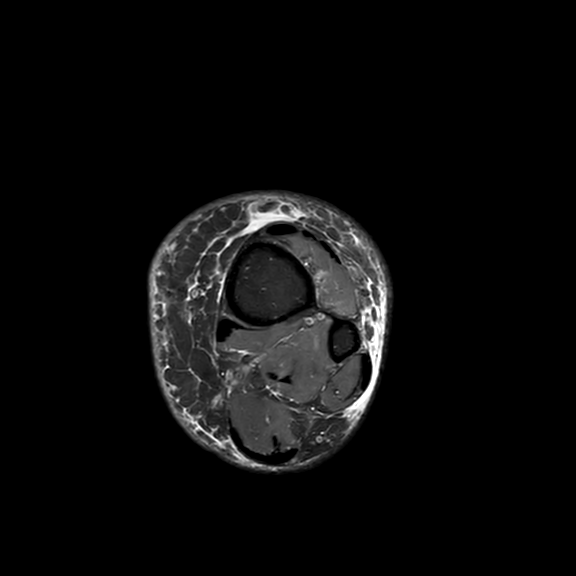

[Series 601: PD fat-sat · coronal · left · 3.0mm · 0.35mm/px · 3 of 40 slices shown (3 of 4)]
[im 6/40]
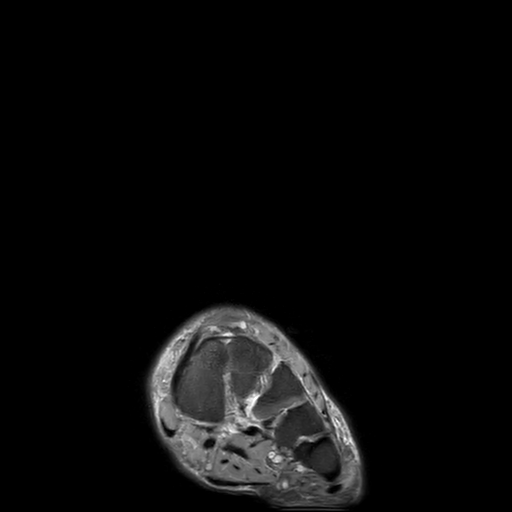
[im 23/40]
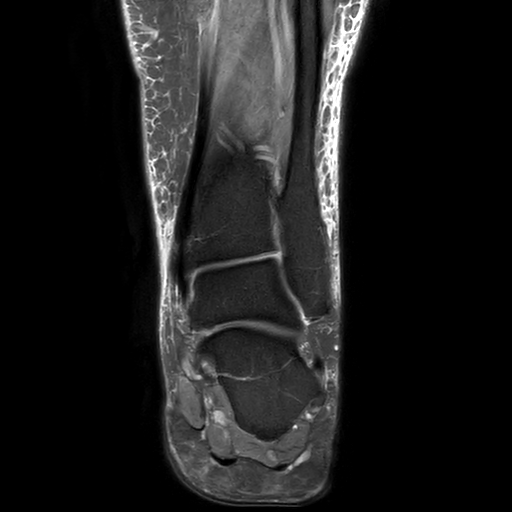
[im 34/40]
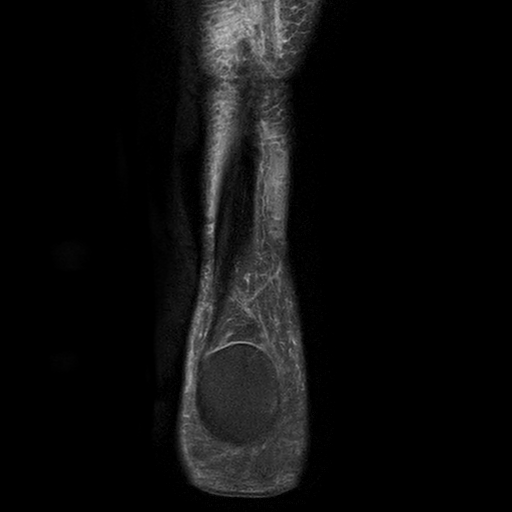

[Series 701: PD fat-sat · sagittal · left · 2.5mm · 0.36mm/px · 3 of 28 slices shown (4 of 4)]
[im 6/28]
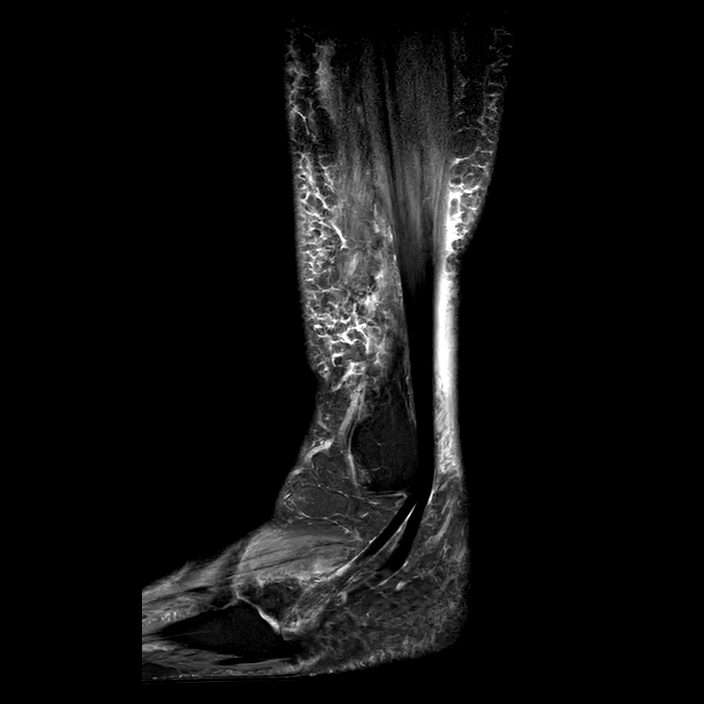
[im 17/28]
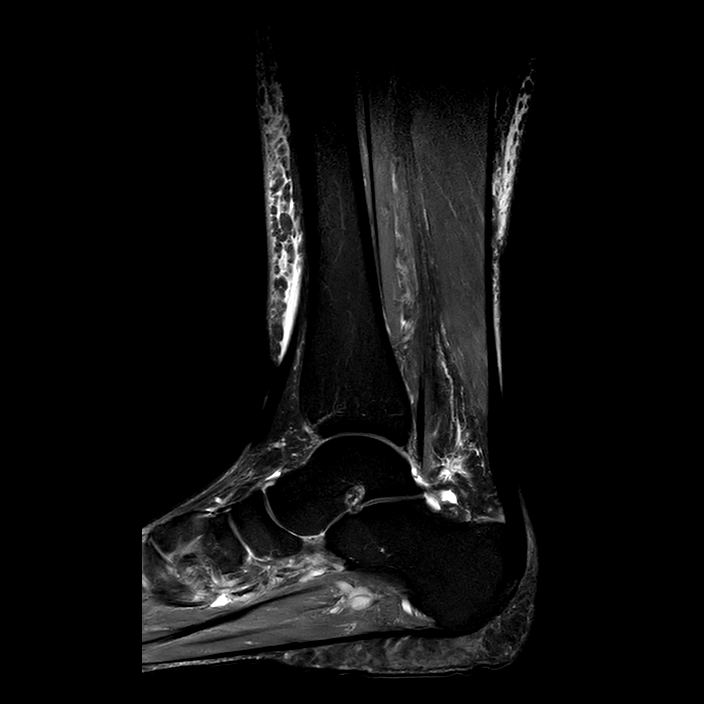
[im 28/28]
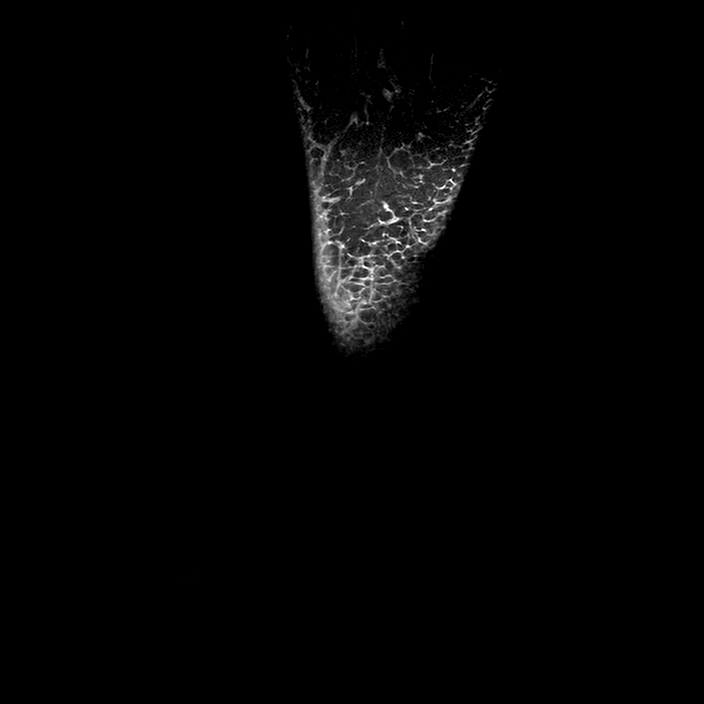

[17 of 40 positions shown; findings below may reference images not displayed]

FINDINGS: TENDONS: Tiny foci of intermediate signal intensity in the distal Achilles 
tendon, consistent with tendinosis. The distal Achilles tendon measures up to 6 
cm in AP dimensions. No Achilles tendon tear. The flexor, extensor and peroneal 
tendons are intact.  
LIGAMENTS:  
LATERAL LIGAMENTS: The anterior talofibular ligament is intact. The 
calcaneofibular ligament and posterior talofibular ligament are preserved. 
SYNDESMOTIC LIGAMENTS: The anterior and posterior tibiofibular and interosseous 
ligaments are preserved. 
DELTOID LIGAMENTOUS COMPLEX: The deep and superficial components of the deltoid 
ligamentous complex are intact. 
SINUS TARSI LIGAMENTS: The cervical and interosseous ligaments are preserved. 
The inferior extensor retinaculum appears intact.  
BONES AND SOFT TISSUES: Small posterior and plantar calcaneal enthesophytes. 
Mild degenerative change of the calcaneocuboid joint with marginal 
full-thickness chondromalacia and small proximal cuboid subcortical. Normal bone 
marrow signal intensity. No fracture, contusion or stress response. The talar 
dome is preserved. No focal osteochondral lesion. Musculature is symmetric 
without mass, signal abnormality or atrophy. Sinus tarsi fat is preserved. 
Plantar fascia is intact. Tarsal tunnel is preserved. Subcutaneous soft tissue 
swelling.
IMPRESSION: 1. Mild distal Achilles tendinosis. 
2. Small posterior and plantar calcaneal enthesophytes.  
3. Mild degenerative change of the calcaneocuboid joint.  
4. Subcutaneous soft tissue swelling.
